# Patient Record
Sex: Female | Born: 1990
Health system: Southern US, Community
[De-identification: ages and names within clinical notes are randomized; demographics above are authoritative.]

## PROBLEM LIST (undated history)

## (undated) DIAGNOSIS — F419 Anxiety disorder, unspecified: Secondary | ICD-10-CM

## (undated) DIAGNOSIS — F32A Depression, unspecified: Secondary | ICD-10-CM

## (undated) DIAGNOSIS — I10 Essential (primary) hypertension: Secondary | ICD-10-CM

---

## 2020-08-08 ENCOUNTER — Emergency Department (HOSPITAL_COMMUNITY)
Admission: EM | Admit: 2020-08-08 | Discharge: 2020-08-08 | Disposition: A | Discharge status: Home / Self Care | Payer: Medicaid Other | Attending: Emergency Medicine | Admitting: Emergency Medicine

## 2020-08-08 ENCOUNTER — Other Ambulatory Visit: Payer: Self-pay

## 2020-08-08 ENCOUNTER — Other Ambulatory Visit (HOSPITAL_COMMUNITY): Payer: Self-pay | Admitting: Physician Assistant

## 2020-08-08 ENCOUNTER — Emergency Department (HOSPITAL_COMMUNITY): Payer: Medicaid Other

## 2020-08-08 ENCOUNTER — Encounter (HOSPITAL_COMMUNITY): Payer: Self-pay

## 2020-08-08 DIAGNOSIS — L298 Other pruritus: Secondary | ICD-10-CM | POA: Insufficient documentation

## 2020-08-08 DIAGNOSIS — I10 Essential (primary) hypertension: Secondary | ICD-10-CM | POA: Insufficient documentation

## 2020-08-08 DIAGNOSIS — Z760 Encounter for issue of repeat prescription: Secondary | ICD-10-CM | POA: Diagnosis not present

## 2020-08-08 DIAGNOSIS — R21 Rash and other nonspecific skin eruption: Secondary | ICD-10-CM | POA: Insufficient documentation

## 2020-08-08 HISTORY — DX: Anxiety disorder, unspecified: F41.9

## 2020-08-08 HISTORY — DX: Depression, unspecified: F32.A

## 2020-08-08 HISTORY — DX: Essential (primary) hypertension: I10

## 2020-08-08 MED ORDER — CEPHALEXIN 500 MG PO CAPS: 500.0000 mg | ORAL_CAPSULE | Freq: Two times a day (BID) | ORAL | 0 refills | Status: DC

## 2020-08-08 MED ORDER — HYDROCORTISONE 1 % EX CREA: TOPICAL_CREAM | CUTANEOUS | 0 refills | Status: DC

## 2020-08-08 MED FILL — CEPHALEXIN 500 MG CAPSULE: 500 | 7 days supply | Qty: 14 | Fill #0

## 2020-08-08 NOTE — Discharge Instructions (Signed)
I given you contact information for dermatology and primary care follow-up.  Take the medications as prescribed.  Return for any worsening symptoms.

## 2020-08-08 NOTE — ED Triage Notes (Signed)
Patient states she thought she had an insect bite to the right outer heel 2 months ago. Patient states she has been treating it with tea tree oil, coconut oil and something else. The area is now scabbed and crusted over. Patient has 2 other small areas to the inner wrist of both hands.

## 2020-08-08 NOTE — ED Provider Notes (Signed)
[facility]-EMERGENCY DEPT Provider Note   CSN: [id number] Arrival date & time: 08/08/20  1039     History Chief Complaint  Patient presents with   Rash   Medication Refill    [facility] is a 29 y.o. female with past medical history significant for hypertension, anxiety who presents for evaluation of rash.  Patient thought she initially was bit by an insect to her right lateral foot 2 months ago.  Patient states had developed scaling, erythema and pruritus to this area.  Area nonpainful.  Has been treating it with tea tree oil, coconut oil.  Patient states the area will randomly scab, bleed and then crust over, improve and then worsen.  Not noticed any bony tenderness, pain with flexion and extension to her joints.  Denies fever, chills, nausea, vomiting, chest pain, shortness of breath, unilateral leg swelling, warmth, paresthesias.  Denies additional aggravating or alleviating factors. No recent tick bites, new lotions, perfumes.  History obtained from patient and past medical records. No interpreter used.  HPI     Past Medical History:  Diagnosis Date   Anxiety    Depression    Hypertension     There are no problems to display for this patient.   History reviewed. No pertinent surgical history.   OB History   No obstetric history on file.     Family History  Problem Relation Age of Onset   Hypertension Mother    Diabetes Mother    Diabetes Father     Social History   Tobacco Use   Smoking status: Never Smoker   Smokeless tobacco: Never Used  Building services engineer Use: Some days   Substances: Nicotine, Flavoring  Substance Use Topics   Alcohol use: Yes    Comment: socially   Drug use: Yes    Types: Marijuana    Home Medications Prior to Admission medications   Medication Sig Start Date End Date Taking? Authorizing Provider  cephALEXin (KEFLEX) 500 MG capsule Take 1 capsule (500 mg total) by mouth 2  (two) times daily for 7 days. 08/08/20 08/15/20  Seidy Labreck A, PA-C  hydrocortisone cream 1 % Apply to affected area 2 times daily 08/08/20   Lelynd Poer A, PA-C    Allergies    Patient has no known allergies.  Review of Systems   Review of Systems  Constitutional: Negative.   HENT: Negative.   Respiratory: Negative.   Cardiovascular: Negative.   Gastrointestinal: Negative.   Genitourinary: Negative.   Musculoskeletal: Negative.   Skin: Positive for rash.  Neurological: Negative.   All other systems reviewed and are negative.   Physical Exam Updated Vital Signs BP (!) 110/95 (BP Location: Left Arm)    Pulse 76    Temp 98 F (36.7 C) (Oral)    Resp 16    Ht 5\' 11"  (1.803 m)    Wt (!) 189.1 kg    LMP 06/25/2020    SpO2 98%    BMI 58.16 kg/m   Physical Exam Vitals and nursing note reviewed.  Constitutional:      General: She is not in acute distress.    Appearance: She is well-developed. She is not ill-appearing, toxic-appearing or diaphoretic.  HENT:     Head: Normocephalic and atraumatic.     Nose: Nose normal.     Mouth/Throat:     Mouth: Mucous membranes are moist.  Eyes:     Pupils: Pupils are equal, round, and reactive to light.  Cardiovascular:     Rate and Rhythm: Normal rate.     Pulses: Normal pulses.     Heart sounds: Normal heart sounds.  Pulmonary:     Effort: Pulmonary effort is normal. No respiratory distress.     Breath sounds: Normal breath sounds.  Abdominal:     General: Bowel sounds are normal. There is no distension.  Musculoskeletal:        General: No swelling, tenderness, deformity or signs of injury. Normal range of motion.     Cervical back: Normal range of motion.     Right lower leg: No edema.     Left lower leg: No edema.     Comments: Full range of motion with flexion, extension to bilateral upper and lower joints.  No tenderness with inversion eversion right ankle.  No bony tenderness.  Compartments soft.  Skin:    General:  Skin is warm and dry.     Capillary Refill: Capillary refill takes less than 2 seconds.     Comments: 10 cm, rounded, scaling, erythematous lesion to right lateral foot.  Some mild underlying soft tissue swelling.  Neurological:     General: No focal deficit present.     Mental Status: She is alert and oriented to person, place, and time.     Comments: Intact sensation Ambulatory without difficulty      ED Results / Procedures / Treatments   Labs (all labs ordered are listed, but only abnormal results are displayed) Labs Reviewed - No data to display  EKG None  Radiology DG Ankle Complete Right  Result Date: 08/08/2020 CLINICAL DATA:  Pain.  Insect bite. EXAM: RIGHT ANKLE - COMPLETE 3+ VIEW COMPARISON:  None. FINDINGS: No evidence of acute fracture or dislocation. No erosive change. Mild midfoot degenerative change. Pes planus. Soft tissue swelling about the ankle and foot. IMPRESSION: 1. No acute fracture or malalignment. 2. Soft tissue swelling without radiographic evidence of osteomyelitis. 3. Pes planus. Electronically Signed   By: Feliberto Harts MD   On: 08/08/2020 13:00    Procedures Procedures (including critical care time)  Medications Ordered in ED Medications - No data to display  ED Course  I have reviewed the triage vital signs and the nursing notes.  Pertinent labs & imaging results that were available during my care of the patient were reviewed by me and considered in my medical decision making (see chart for details).  29 year old presents for evaluation of rash to right foot.  Has been present times months.  10 cm, scaling, erythematous rash to lateral right foot.  No underlying bony tenderness.  She is neurovascularly intact.  She has no pain with range of motion.  No additional rashes.  No involvement to intraoral mucosa.  No recent lotions, perfumes, tick bites.  Pruritic in nature, patient states it does begin to bleed then will heal, scab over and then  recur.  Does seem to have some sort of eczema or atopic dermatitis component.  Will treat with topical medication as well as oral antibiotics in case infectious process.  X-ray does show some soft tissue swelling however no bony effusion, evidence of osteomyelitis, gas-forming organism.  Patient denies any difficulty breathing or swallowing.  Pt has a patent airway without stridor and is handling secretions without difficulty; no angioedema. No blisters, no pustules, no warmth, no draining sinus tracts, no superficial abscesses, no bullous impetigo, no vesicles, no desquamation, no target lesions with dusky purpura or a central bulla. Not tender to  touch. No concern for superimposed infection. No concern for SJS, TEN, TSS, tick borne illness, syphilis or other life-threatening condition.  I have discussed with patient she possibly needs follow-up with dermatology given rashes been present x1 month and has not resolved.  She is also asking for referral to PCP.  I will give her referral at discharge.  The patient has been appropriately medically screened and/or stabilized in the ED. I have low suspicion for any other emergent medical condition which would require further screening, evaluation or treatment in the ED or require inpatient management.  Patient is hemodynamically stable and in no acute distress.  Patient able to ambulate in department prior to ED.  Evaluation does not show acute pathology that would require ongoing or additional emergent interventions while in the emergency department or further inpatient treatment.  I have discussed the diagnosis with the patient and answered all questions.  Pain is been managed while in the emergency department and patient has no further complaints prior to discharge.  Patient is comfortable with plan discussed in room and is stable for discharge at this time.  I have discussed strict return precautions for returning to the emergency department.  Patient was  encouraged to follow-up with PCP/specialist refer to at discharge.   MDM Rules/Calculators/A&P                           Final Clinical Impression(s) / ED Diagnoses Final diagnoses:  Rash    Rx / DC Orders ED Discharge Orders         Ordered    cephALEXin (KEFLEX) 500 MG capsule  2 times daily        08/08/20 1341    hydrocortisone cream 1 %        08/08/20 1341           Shayanna Thatch A, PA-C 08/08/20 1342    Roberta Mulders, MD 08/09/20 [phone]

## 2020-09-05 ENCOUNTER — Other Ambulatory Visit: Payer: Medicaid Other

## 2020-09-05 DIAGNOSIS — Z20822 Contact with and (suspected) exposure to covid-19: Secondary | ICD-10-CM

## 2020-09-06 LAB — SARS-COV-2, NAA 2 DAY TAT

## 2020-09-06 LAB — NOVEL CORONAVIRUS, NAA: SARS-CoV-2, NAA: NOT DETECTED

## 2020-09-18 ENCOUNTER — Other Ambulatory Visit: Payer: Self-pay

## 2020-09-18 ENCOUNTER — Ambulatory Visit (HOSPITAL_COMMUNITY)
Admission: AD | Admit: 2020-09-18 | Discharge: 2020-09-18 | Disposition: A | Discharge status: Home / Self Care | Payer: Medicaid Other | Attending: Psychiatry | Admitting: Psychiatry

## 2020-09-18 ENCOUNTER — Other Ambulatory Visit (HOSPITAL_COMMUNITY): Payer: Self-pay | Admitting: Psychiatry

## 2020-09-18 ENCOUNTER — Encounter (HOSPITAL_COMMUNITY): Payer: Self-pay

## 2020-09-18 DIAGNOSIS — F322 Major depressive disorder, single episode, severe without psychotic features: Secondary | ICD-10-CM | POA: Diagnosis not present

## 2020-09-18 DIAGNOSIS — F439 Reaction to severe stress, unspecified: Secondary | ICD-10-CM | POA: Insufficient documentation

## 2020-09-18 DIAGNOSIS — F332 Major depressive disorder, recurrent severe without psychotic features: Secondary | ICD-10-CM

## 2020-09-18 MED ORDER — TRAZODONE HCL 50 MG PO TABS: 25.0000 mg | ORAL_TABLET | Freq: Every day | ORAL | 0 refills | Status: DC

## 2020-09-18 MED ORDER — BUPROPION HCL ER (XL) 150 MG PO TB24: 150.0000 mg | ORAL_TABLET | ORAL | 0 refills | Status: DC

## 2020-09-18 MED ORDER — FLUOXETINE HCL 20 MG PO CAPS: 20.0000 mg | ORAL_CAPSULE | Freq: Every day | ORAL | 0 refills | Status: DC

## 2020-09-18 MED FILL — traZODone HCL 50 MG TABS: 50 | 30 days supply | Qty: 15 | Fill #0

## 2020-09-18 MED FILL — BUPROPION HCL XL 150 MG TAB: 150 | 30 days supply | Qty: 30 | Fill #0

## 2020-09-18 MED FILL — FLUoxetine HCL 20 MG CAPS: 20 | 30 days supply | Qty: 30 | Fill #0

## 2020-09-18 NOTE — ED Provider Notes (Signed)
[facility] Medical Screening [facility]am  Patient Name: [facility] MRN: [id number] Date of Evaluation: 09/18/20 Chief Complaint: Chief Complaint/Presenting Problem: Ongoing depression and anxiety Diagnosis:  Final diagnoses:  MDD (major depressive disorder), recurrent severe, without psychosis (HCC)    History of Present illness: [facility] Todd is a 29 y.o. female.  Patient presents as a walk-in voluntarily to the [facility] with her significant other.  She reports that she has been dealing with a lot of depression and stress recently.  She states that she moved to [place] from [place] approximately 3 months ago.  She states that she moved here to live with her female partner.  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  She reports that she has a history of major depressive disorder and was started on Wellbutrin, Prozac, and trazodone and was on it for approximately 7 months but stopped it approximately 5 months ago.  She states that with the stress of moving as well as the new relationship and not been able to find employment at this time and is caused her to be more depressed.  She states this morning she had not reached her breaking point and took some lisinopril and put approximately 10 tabs in her mouth but then spit them out and was with her significant other.  Her significant other states that she had they have had a long conversation about it and she feels safe with her discharging home with her.  They both report they do not feel that she needs to be admitted to the hospital.  They are requesting for her to restart her medications of Wellbutrin, Prozac, and trazodone.  She does report that the trazodone seemed to make her a little too drowsy and it was encouraged for her to take a lower dose.  The patient denies having any suicidal or homicidal ideations and  denies any hallucinations at this time.  Patient presents to be alert, oriented, very pleasant.  She is laughing and smiling during the evaluation but continues to report feeling depressed due to the stress.  Safety plan was established with patient's significant other and will be reported that there is a following house but it does have a lock on it and the patient does not have access to it or the key.  Psychiatric Specialty Exam  Presentation  General Appearance:Appropriate for Environment;Casual  Eye Contact:Good  Speech:Clear and Coherent;Normal Rate  Speech Volume:Normal  Handedness:Right   Mood and Affect  Mood:Euthymic  Affect:Appropriate;Congruent   Thought Process  Thought Processes:Coherent  Descriptions of Associations:Intact  Orientation:Full (Time, Place and Person)  Thought Content:WDL  Hallucinations:None  Ideas of Reference:None  Suicidal Thoughts:No  Homicidal Thoughts:No   Sensorium  Memory:Immediate Good;Recent Good;Remote Good  Judgment:Good  Insight:Good   Executive Functions  Concentration:Good  Attention Span:Good  Recall:Good  Fund of Knowledge:Good  Language:Good   Psychomotor Activity  Psychomotor Activity:Normal   Assets  Assets:Communication Skills;Desire for Improvement   Sleep  Sleep:Fair  Number of hours: No data recorded  Physical Exam: Physical Exam Vitals and nursing note reviewed.  Constitutional:      Appearance: She is well-developed. She is obese.  HENT:     Head: Normocephalic.  Eyes:     Pupils: Pupils are equal, round, and reactive to light.  Cardiovascular:     Rate and Rhythm: Normal rate.  Pulmonary:     Effort: Pulmonary  effort is normal.  Musculoskeletal:        General: Normal range of motion.  Neurological:     Mental Status: She is alert and oriented to person, place, and time.    Review of Systems  Constitutional: Negative.   HENT: Negative.   Eyes: Negative.    Respiratory: Negative.   Cardiovascular: Negative.   Gastrointestinal: Negative.   Genitourinary: Negative.   Musculoskeletal: Negative.   Skin: Negative.   Neurological: Negative.   Endo/Heme/Allergies: Negative.   Psychiatric/Behavioral: Positive for depression.   Pulse 92, temperature 98.4 F (36.9 C), temperature source Oral, resp. rate 18, height 5\' 11"  (1.803 m), weight (!) 378 lb (171.5 kg), SpO2 100 %. Body mass index is 52.72 kg/m.  Musculoskeletal: Strength & Muscle Tone: within normal limits Gait & Station: normal Patient leans: N/A   [facility] Discharge Disposition for Follow up and Recommendations: Based on my evaluation the patient does not appear to have an emergency medical condition and can be discharged with resources and follow up care in outpatient services for Medication Management and Individual Therapy   , FNP 09/18/2020, 2:16 PM

## 2020-09-18 NOTE — ED Notes (Signed)
Patient A&O x 4, ambulatory. Patient discharged in no acute distress. Patient denied SI/HI, A/VH upon discharge. Patient verbalized understanding of all discharge instructions explained by staff, to include follow up appointments, RX's and safety plan. Patient reported mood 10/10.  Pt belongings returned to patient from locker #11 intact. Patient escorted to lobby via staff for self transport to destination. Safety maintained.

## 2020-09-18 NOTE — ED Notes (Signed)
LOCKER #[id number] 
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
## 2020-09-18 NOTE — Discharge Instructions (Addendum)
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
## 2020-09-20 ENCOUNTER — Other Ambulatory Visit: Payer: Self-pay

## 2020-09-20 ENCOUNTER — Ambulatory Visit (INDEPENDENT_AMBULATORY_CARE_PROVIDER_SITE_OTHER): Payer: Medicaid Other | Admitting: Clinical

## 2020-09-20 DIAGNOSIS — F331 Major depressive disorder, recurrent, moderate: Secondary | ICD-10-CM
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
## 2020-09-27 ENCOUNTER — Telehealth (HOSPITAL_COMMUNITY): Payer: Self-pay

## 2020-09-27 NOTE — Telephone Encounter (Signed)
Care Management - Follow Up [facility] Discharges   Writer attempted to make contact with patient today and was unsuccessful.  Writer was able to leave a HIPPA compliant voice message and will await callback.  Per chart review, patient has completed an appointment with [facility] on 09-20-2020.  Patient has a follow up visit with Dr. Evelene Croon on 10-17-2020.

## 2020-10-17 ENCOUNTER — Other Ambulatory Visit: Payer: Self-pay

## 2020-10-17 ENCOUNTER — Telehealth (HOSPITAL_COMMUNITY): Payer: Medicaid Other

## 2020-11-10 ENCOUNTER — Ambulatory Visit (HOSPITAL_COMMUNITY): Payer: Medicaid Other | Admitting: Clinical

## 2020-11-10 ENCOUNTER — Other Ambulatory Visit: Payer: Self-pay

## 2020-11-10 ENCOUNTER — Telehealth (HOSPITAL_COMMUNITY): Payer: Self-pay | Admitting: Clinical

## 2020-11-10 NOTE — Telephone Encounter (Signed)
Therapist sent the client a text message link via MyChart for the scheduled virtual therapy visit. Client did not check in using the link. Therapist attempted to call the clients cell phone but received the automated message of "call cannot be completed as dialed". Therapist was unable to leave a voicemail.

## 2020-12-01 ENCOUNTER — Ambulatory Visit (HOSPITAL_COMMUNITY): Payer: Self-pay | Admitting: Clinical

## 2021-04-18 DIAGNOSIS — R69 Illness, unspecified: Secondary | ICD-10-CM | POA: Diagnosis not present

## 2021-04-18 DIAGNOSIS — F419 Anxiety disorder, unspecified: Secondary | ICD-10-CM | POA: Diagnosis not present

## 2021-05-09 DIAGNOSIS — F419 Anxiety disorder, unspecified: Secondary | ICD-10-CM | POA: Diagnosis not present

## 2021-05-09 DIAGNOSIS — R69 Illness, unspecified: Secondary | ICD-10-CM | POA: Diagnosis not present

## 2021-10-02 ENCOUNTER — Other Ambulatory Visit: Payer: Self-pay

## 2021-10-02 ENCOUNTER — Emergency Department (HOSPITAL_COMMUNITY): Payer: 59

## 2021-10-02 ENCOUNTER — Encounter (HOSPITAL_COMMUNITY): Payer: Self-pay

## 2021-10-02 ENCOUNTER — Emergency Department (HOSPITAL_COMMUNITY)
Admission: EM | Admit: 2021-10-02 | Discharge: 2021-10-02 | Disposition: A | Discharge status: Home / Self Care | Payer: 59 | Attending: Student | Admitting: Student

## 2021-10-02 DIAGNOSIS — K5792 Diverticulitis of intestine, part unspecified, without perforation or abscess without bleeding: Secondary | ICD-10-CM | POA: Diagnosis not present

## 2021-10-02 DIAGNOSIS — Z20822 Contact with and (suspected) exposure to covid-19: Secondary | ICD-10-CM | POA: Insufficient documentation

## 2021-10-02 DIAGNOSIS — R1032 Left lower quadrant pain: Secondary | ICD-10-CM | POA: Diagnosis present

## 2021-10-02 DIAGNOSIS — I10 Essential (primary) hypertension: Secondary | ICD-10-CM | POA: Diagnosis not present

## 2021-10-02 DIAGNOSIS — D72829 Elevated white blood cell count, unspecified: Secondary | ICD-10-CM | POA: Diagnosis not present

## 2021-10-02 DIAGNOSIS — R109 Unspecified abdominal pain: Secondary | ICD-10-CM | POA: Diagnosis not present

## 2021-10-02 LAB — CBC
HCT: 35.6 % — ABNORMAL LOW (ref 36.0–46.0)
Hemoglobin: 10.7 g/dL — ABNORMAL LOW (ref 12.0–15.0)
MCH: 23.2 pg — ABNORMAL LOW (ref 26.0–34.0)
MCHC: 30.1 g/dL (ref 30.0–36.0)
MCV: 77.2 fL — ABNORMAL LOW (ref 80.0–100.0)
Platelets: 324 10*3/uL (ref 150–400)
RBC: 4.61 MIL/uL (ref 3.87–5.11)
RDW: 16.4 % — ABNORMAL HIGH (ref 11.5–15.5)
WBC: 15.1 10*3/uL — ABNORMAL HIGH (ref 4.0–10.5)
nRBC: 0 % (ref 0.0–0.2)

## 2021-10-02 LAB — URINALYSIS, ROUTINE W REFLEX MICROSCOPIC
Bilirubin Urine: NEGATIVE
Glucose, UA: NEGATIVE mg/dL
Hgb urine dipstick: NEGATIVE
Ketones, ur: 5 mg/dL — AB
Leukocytes,Ua: NEGATIVE
Nitrite: NEGATIVE
Protein, ur: NEGATIVE mg/dL
Specific Gravity, Urine: 1.027 (ref 1.005–1.030)
pH: 5 (ref 5.0–8.0)

## 2021-10-02 LAB — COMPREHENSIVE METABOLIC PANEL
ALT: 8 U/L (ref 0–44)
AST: 18 U/L (ref 15–41)
Albumin: 3.6 g/dL (ref 3.5–5.0)
Alkaline Phosphatase: 65 U/L (ref 38–126)
Anion gap: 9 (ref 5–15)
BUN: 12 mg/dL (ref 6–20)
CO2: 19 mmol/L — ABNORMAL LOW (ref 22–32)
Calcium: 9.2 mg/dL (ref 8.9–10.3)
Chloride: 106 mmol/L (ref 98–111)
Creatinine, Ser: 0.78 mg/dL (ref 0.44–1.00)
GFR, Estimated: >60 mL/min (ref >60)
Glucose, Bld: 95 mg/dL (ref 70–99)
Potassium: 4.3 mmol/L (ref 3.5–5.1)
Sodium: 134 mmol/L — ABNORMAL LOW (ref 135–145)
Total Bilirubin: 1.2 mg/dL (ref 0.3–1.2)
Total Protein: 7.4 g/dL (ref 6.5–8.1)

## 2021-10-02 LAB — RESP PANEL BY RT-PCR (FLU A&B, COVID) ARPGX2
Influenza A by PCR: NEGATIVE
Influenza B by PCR: NEGATIVE
SARS Coronavirus 2 by RT PCR: NEGATIVE

## 2021-10-02 LAB — PREGNANCY, URINE: Preg Test, Ur: NEGATIVE

## 2021-10-02 LAB — LIPASE, BLOOD: Lipase: 24 U/L (ref 11–51)

## 2021-10-02 LAB — I-STAT BETA HCG BLOOD, ED (MC, WL, AP ONLY): I-stat hCG, quantitative: 6.7 m[IU]/mL — ABNORMAL HIGH (ref <5)

## 2021-10-02 MED ORDER — MORPHINE SULFATE (PF) 4 MG/ML IV SOLN
4.0000 mg | Freq: Once | INTRAVENOUS | Status: DC
  Filled 2021-10-02: qty 1

## 2021-10-02 MED ORDER — LACTATED RINGERS IV BOLUS
1000.0000 mL | Freq: Once | INTRAVENOUS | Status: AC
  Administered 2021-10-02: 1000 mL via INTRAVENOUS

## 2021-10-02 MED ORDER — IOHEXOL 350 MG/ML SOLN
80.0000 mL | Freq: Once | INTRAVENOUS | Status: AC | PRN
  Administered 2021-10-02: 80 mL via INTRAVENOUS

## 2021-10-02 MED ORDER — ONDANSETRON HCL 4 MG/2ML IJ SOLN: 4.0000 mg | Freq: Once | INTRAMUSCULAR | Status: AC

## 2021-10-02 MED ORDER — KETOROLAC TROMETHAMINE 15 MG/ML IJ SOLN
15.0000 mg | Freq: Once | INTRAMUSCULAR | Status: AC
  Administered 2021-10-02: 15 mg via INTRAVENOUS
  Filled 2021-10-02: qty 1

## 2021-10-02 MED ORDER — AMOXICILLIN-POT CLAVULANATE 875-125 MG PO TABS
1.0000 | ORAL_TABLET | Freq: Two times a day (BID) | ORAL | 0 refills | Status: AC
  Filled 2021-10-02: qty 14, 7d supply, fill #0

## 2021-10-02 MED ORDER — ONDANSETRON 4 MG PO TBDP
4.0000 mg | ORAL_TABLET | Freq: Once | ORAL | Status: DC
  Filled 2021-10-02: qty 1

## 2021-10-02 MED ORDER — ONDANSETRON HCL 4 MG/2ML IJ SOLN
INTRAMUSCULAR | Status: AC
  Administered 2021-10-02: 4 mg via INTRAVENOUS
  Filled 2021-10-02: qty 2

## 2021-10-02 MED ORDER — IBUPROFEN 600 MG PO TABS
600.0000 mg | ORAL_TABLET | Freq: Four times a day (QID) | ORAL | 0 refills | Status: AC | PRN
  Filled 2021-10-02: qty 30, 8d supply, fill #0

## 2021-10-02 MED ORDER — MORPHINE SULFATE (PF) 4 MG/ML IV SOLN
6.0000 mg | Freq: Once | INTRAVENOUS | Status: AC
  Administered 2021-10-02: 6 mg via INTRAVENOUS

## 2021-10-02 MED ORDER — ONDANSETRON 4 MG PO TBDP
4.0000 mg | ORAL_TABLET | Freq: Three times a day (TID) | ORAL | 0 refills | Status: AC | PRN
  Filled 2021-10-02: qty 20, 7d supply, fill #0

## 2021-10-02 MED ORDER — AMOXICILLIN-POT CLAVULANATE 875-125 MG PO TABS
1.0000 | ORAL_TABLET | Freq: Once | ORAL | Status: AC
  Administered 2021-10-02: 1 via ORAL
  Filled 2021-10-02: qty 1

## 2021-10-02 MED ORDER — HYDROCODONE-ACETAMINOPHEN 5-325 MG PO TABS: 1.0000 | ORAL_TABLET | ORAL | 0 refills | Status: AC | PRN

## 2021-10-02 NOTE — ED Notes (Signed)
Pt to CT, VS when returned.

## 2021-10-02 NOTE — ED Notes (Signed)
Informed Pt for the need of urine.

## 2021-10-02 NOTE — ED Provider Notes (Signed)
[facility]-EMERGENCY DEPT Provider Note   CSN: [id number] Arrival date & time: 10/02/21  3016     History Chief Complaint  Patient presents with   Abdominal Pain   Emesis    Roberta Todd is a 30 y.o. female with PMH anxiety, depression, HTN, obesity who presents emergency department for evaluation of abdominal pain nausea and vomiting.  Patient states that her symptoms began over the last 24 hours and are worse in the left lower quadrant.  Also endorses left CVA pain.  Denies dysuria, increased frequency, vaginal discharge, chest pain, shortness of breath, headache, cough, fever or other systemic symptoms.  Patient states that she is on birth control and has not been sexually active.  Last menstrual period 12 days ago but she states that she started spotting again.  She does have a history of abnormal uterine bleeding.  She states she had multiple episodes of nonbloody nonbilious emesis last night but has not had any p.o. this morning and thus has not had any additional vomiting.   Abdominal Pain Associated symptoms: nausea and vomiting   Associated symptoms: no chest pain, no chills, no cough, no dysuria, no fever, no hematuria, no shortness of breath and no sore throat   Emesis Associated symptoms: abdominal pain   Associated symptoms: no arthralgias, no chills, no cough, no fever and no sore throat       Past Medical History:  Diagnosis Date   Anxiety    Depression    Hypertension     There are no problems to display for this patient.   History reviewed. No pertinent surgical history.   OB History   No obstetric history on file.     Family History  Problem Relation Age of Onset   Hypertension Mother    Diabetes Mother    Diabetes Father     Social History   Tobacco Use   Smoking status: Never   Smokeless tobacco: Never  Vaping Use   Vaping Use: Former   Substances: Nicotine, Flavoring  Substance Use Topics   Alcohol  use: Yes    Comment: socially   Drug use: Not Currently    Types: Marijuana    Home Medications Prior to Admission medications   Medication Sig Start Date End Date Taking? Authorizing Provider  amoxicillin-clavulanate (AUGMENTIN) 875-125 MG tablet Take 1 tablet by mouth every 12 (twelve) hours. 10/02/21  Yes Avleen Bordwell, MD  HYDROcodone-acetaminophen (NORCO/VICODIN) 5-325 MG tablet Take 1 tablet by mouth every 4 (four) hours as needed. 10/02/21  Yes Lindi Abram, MD  ibuprofen (ADVIL) 600 MG tablet Take 1 tablet (600 mg total) by mouth every 6 (six) hours as needed. 10/02/21  Yes Ahmere Hemenway, MD  ondansetron (ZOFRAN-ODT) 4 MG disintegrating tablet Take 1 tablet (4 mg total) by mouth every 8 (eight) hours as needed for nausea or vomiting. 10/02/21  Yes Gabrella Stroh, MD  buPROPion (WELLBUTRIN XL) 150 MG 24 hr tablet TAKE 1 TABLET (150 MG TOTAL) BY MOUTH EVERY MORNING. 09/18/20 09/18/21  Money, Gerlene Burdock, FNP  FLUoxetine (PROZAC) 20 MG capsule TAKE 1 CAPSULE (20 MG TOTAL) BY MOUTH DAILY. 09/18/20 09/18/21  Money, Gerlene Burdock, FNP  traZODone (DESYREL) 50 MG tablet TAKE 1/2 TABLET (25 MG TOTAL) BY MOUTH AT BEDTIME. 09/18/20 09/18/21  Money, Gerlene Burdock, FNP    Allergies    Patient has no known allergies.  Review of Systems   Review of Systems  Constitutional:  Negative for chills and fever.  HENT:  Negative for ear pain and sore throat.   Eyes:  Negative for pain and visual disturbance.  Respiratory:  Negative for cough and shortness of breath.   Cardiovascular:  Negative for chest pain and palpitations.  Gastrointestinal:  Positive for abdominal pain, nausea and vomiting.  Genitourinary:  Negative for dysuria and hematuria.  Musculoskeletal:  Negative for arthralgias and back pain.  Skin:  Negative for color change and rash.  Neurological:  Negative for seizures and syncope.  All other systems reviewed and are negative.  Physical Exam Updated Vital Signs BP 140/87 (BP  Location: Right Arm)   Pulse 65   Temp 97.7 F (36.5 C) (Oral)   Resp 17   Ht 5\' 10"  (1.778 m)   Wt (!) 180.5 kg   LMP 09/22/2021   SpO2 98%   BMI 57.11 kg/m   Physical Exam Vitals and nursing note reviewed.  Constitutional:      General: She is not in acute distress.    Appearance: She is well-developed. She is ill-appearing.  HENT:     Head: Normocephalic and atraumatic.  Eyes:     Conjunctiva/sclera: Conjunctivae normal.  Cardiovascular:     Rate and Rhythm: Normal rate and regular rhythm.     Heart sounds: No murmur heard. Pulmonary:     Effort: Pulmonary effort is normal. No respiratory distress.     Breath sounds: Normal breath sounds.  Abdominal:     Palpations: Abdomen is soft.     Tenderness: There is abdominal tenderness in the left lower quadrant. There is left CVA tenderness.  Musculoskeletal:        General: No swelling.     Cervical back: Neck supple.  Skin:    General: Skin is warm and dry.     Capillary Refill: Capillary refill takes less than 2 seconds.  Neurological:     Mental Status: She is alert.  Psychiatric:        Mood and Affect: Mood normal.    ED Results / Procedures / Treatments   Labs (all labs ordered are listed, but only abnormal results are displayed) Labs Reviewed  COMPREHENSIVE METABOLIC PANEL - Abnormal; Notable for the following components:      Result Value   Sodium 134 (*)    CO2 19 (*)    All other components within normal limits  CBC - Abnormal; Notable for the following components:   WBC 15.1 (*)    Hemoglobin 10.7 (*)    HCT 35.6 (*)    MCV 77.2 (*)    MCH 23.2 (*)    RDW 16.4 (*)    All other components within normal limits  URINALYSIS, ROUTINE W REFLEX MICROSCOPIC - Abnormal; Notable for the following components:   APPearance HAZY (*)    Ketones, ur 5 (*)    All other components within normal limits  I-STAT BETA HCG BLOOD, ED ([facility], [facility], [facility] ONLY) - Abnormal; Notable for the following components:   I-stat hCG,  quantitative 6.7 (*)    All other components within normal limits  RESP PANEL BY RT-PCR (FLU A&B, COVID) ARPGX2  LIPASE, BLOOD  PREGNANCY, URINE    EKG None  Radiology CT ABDOMEN PELVIS W CONTRAST  Result Date: 10/02/2021 CLINICAL DATA:  Left lower quadrant abdominal pain. EXAM: CT ABDOMEN AND PELVIS WITH CONTRAST TECHNIQUE: Multidetector CT imaging of the abdomen and pelvis was performed using the standard protocol following bolus administration of intravenous contrast. CONTRAST:  18mL OMNIPAQUE IOHEXOL 350 MG/ML SOLN COMPARISON:  None. FINDINGS: Lower chest: No acute abnormality. Hepatobiliary: No suspicious hepatic lesion. Gallbladder is unremarkable. No biliary ductal dilation. Pancreas: No pancreatic ductal dilation or evidence of acute inflammation. Spleen: Within normal limits. Adrenals/Urinary Tract: Adrenal glands are unremarkable. Kidneys are normal, without renal calculi, solid enhancing lesion, or hydronephrosis. Bladder is unremarkable for degree of distension. Stomach/Bowel: No enteric contrast was administered. Stomach is unremarkable for degree of distension. No pathologic dilation of small or large bowel. The appendix and terminal ileum appear normal. Colonic diverticulosis with short segment inflammatory wall thickening and adjacent stranding involving the descending colon. No pneumatosis. Vascular/Lymphatic: No significant vascular findings are present. No pathologically enlarged abdominal or pelvic lymph nodes. Reproductive: Uterus and bilateral ovaries are unremarkable. Tampon in the vagina. Other: No walled off fluid collections.  No pneumoperitoneum. Musculoskeletal: No acute osseous abnormality. IMPRESSION: Acute uncomplicated descending colonic diverticulitis. Electronically Signed   By: Maudry MayhewJeffrey  Waltz M.D.   On: 10/02/2021 11:19    Procedures Procedures   Medications Ordered in ED Medications  lactated ringers bolus 1,000 mL (0 mLs Intravenous Stopped 10/02/21 1129)   morphine 4 MG/ML injection 6 mg (6 mg Intravenous Given 10/02/21 0822)  ondansetron (ZOFRAN) injection 4 mg (4 mg Intravenous Given 10/02/21 0823)  iohexol (OMNIPAQUE) 350 MG/ML injection 80 mL (80 mLs Intravenous Contrast Given 10/02/21 1059)  amoxicillin-clavulanate (AUGMENTIN) 875-125 MG per tablet 1 tablet (1 tablet Oral Given 10/02/21 1152)  ketorolac (TORADOL) 15 MG/ML injection 15 mg (15 mg Intravenous Given 10/02/21 1152)    ED Course  I have reviewed the triage vital signs and the nursing notes.  Pertinent labs & imaging results that were available during my care of the patient were reviewed by me and considered in my medical decision making (see chart for details).    MDM Rules/Calculators/A&P                           Patient seen emergency department for evaluation of abdominal pain.  Physical exam reveals left lower quadrant tenderness to palpation but no guarding.  Laboratory evaluation with a significant leukocytosis to 15.1, CO2 19 but is otherwise unremarkable.  I-STAT hCG elevated at 6.7 but follow-up urine pregnancy is negative.  Low suspicion for intrauterine pregnancy.  COVID and flu negative.  CT abdomen pelvis with acute uncomplicated a sending diverticulitis.  Patient given first dose of Augmentin here and pain controlled with morphine and Toradol.  Patient given a prescription for Augmentin, ibuprofen and a short prescription for Norco for breakthrough pain only.  She will follow-up with her primary care physician as this is not her first episode of abdominal pain like this.  Patient then discharged. Final Clinical Impression(s) / ED Diagnoses Final diagnoses:  Diverticulitis    Rx / DC Orders ED Discharge Orders          Ordered    amoxicillin-clavulanate (AUGMENTIN) 875-125 MG tablet  Every 12 hours        10/02/21 1123    ondansetron (ZOFRAN-ODT) 4 MG disintegrating tablet  Every 8 hours PRN        10/02/21 1123    ibuprofen (ADVIL) 600 MG tablet  Every 6  hours PRN        10/02/21 1133    HYDROcodone-acetaminophen (NORCO/VICODIN) 5-325 MG tablet  Every 4 hours PRN        10/02/21 1134             Ayrton Mcvay, Wyn ForsterMadison, MD 10/02/21 1336

## 2021-10-02 NOTE — ED Triage Notes (Signed)
Patient c/o left abdominal pain, emesis that started yesterday.. Patient denies diarrhea.

## 2023-04-08 IMAGING — CT CT ABD-PELV W/ CM
2 of 4 series · 17 of 46 positions shown, 19 images · IV contrast (OMNIPAQUE 350)
Comparison: None.

CLINICAL DATA: Left lower quadrant abdominal pain.

EXAM:
CT ABDOMEN AND PELVIS WITH CONTRAST
TECHNIQUE: Multidetector CT imaging of the abdomen and pelvis was performed
using the standard protocol following bolus administration of
intravenous contrast.
CONTRAST:  80mL OMNIPAQUE IOHEXOL 350 MG/ML SOLN

[Series 2: axial st · axial · 0.87mm/px · z∈[-148,+277]mm · 14 of 97 slices shown, 16 images]
[im 6/97  soft-tissue]
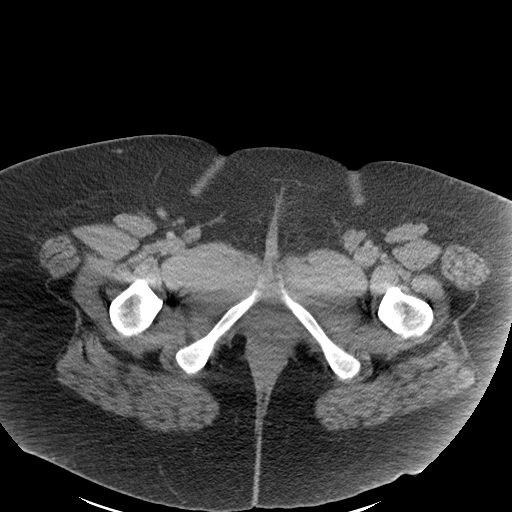
[im 6/97  bone]
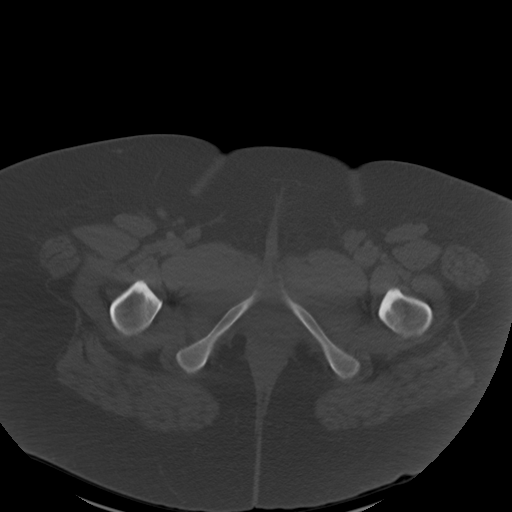
[im 12/97  soft-tissue]
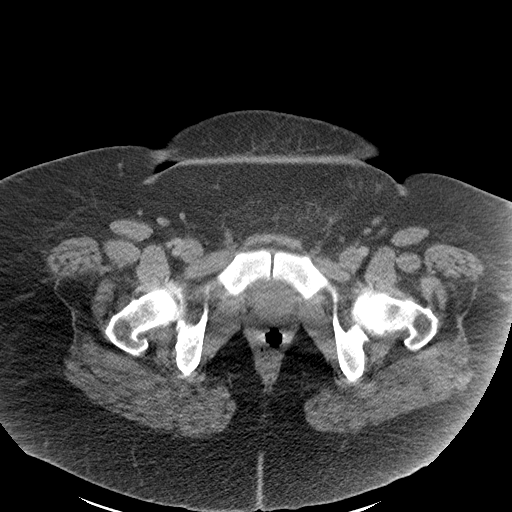
[im 17/97  soft-tissue]
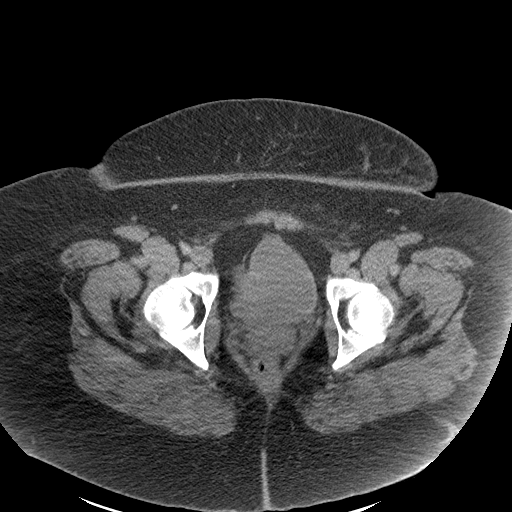
[im 29/97  soft-tissue]
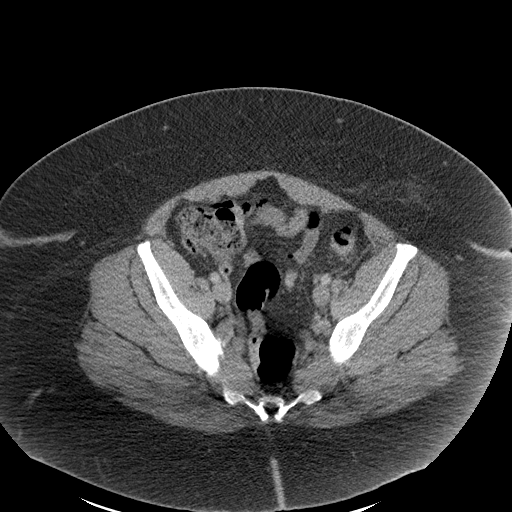
[im 34/97  soft-tissue]
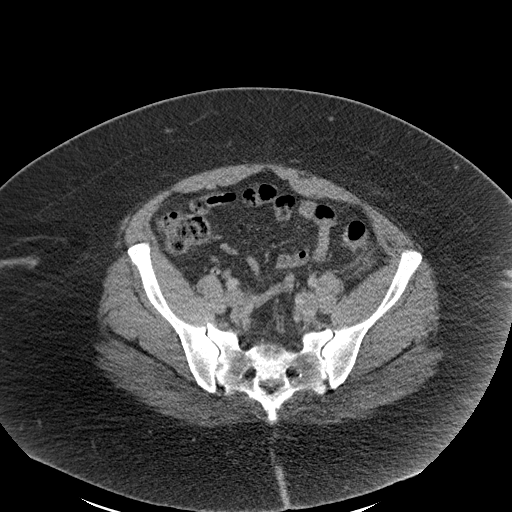
[im 40/97  soft-tissue]
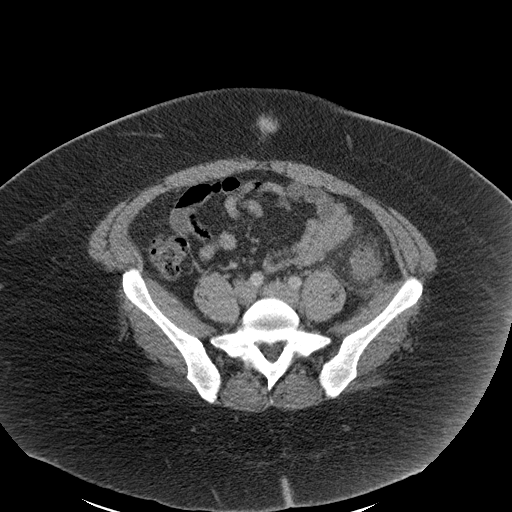
[im 46/97  soft-tissue]
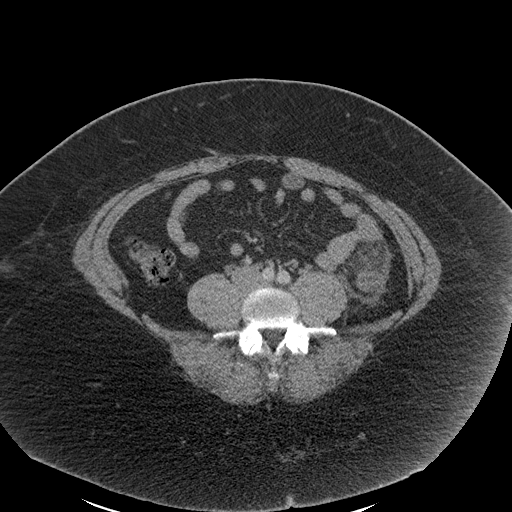
[im 51/97  soft-tissue]
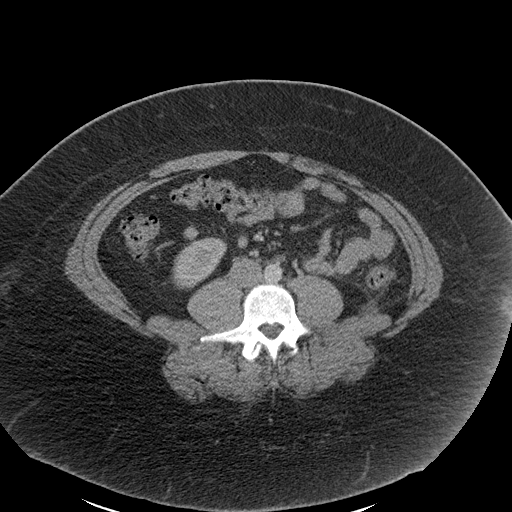
[im 57/97  soft-tissue]
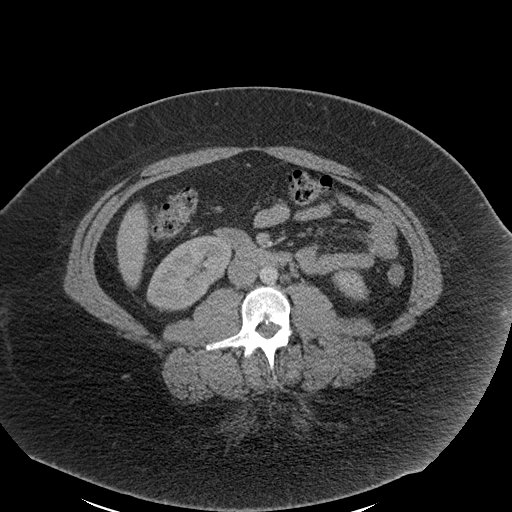
[im 57/97  bone]
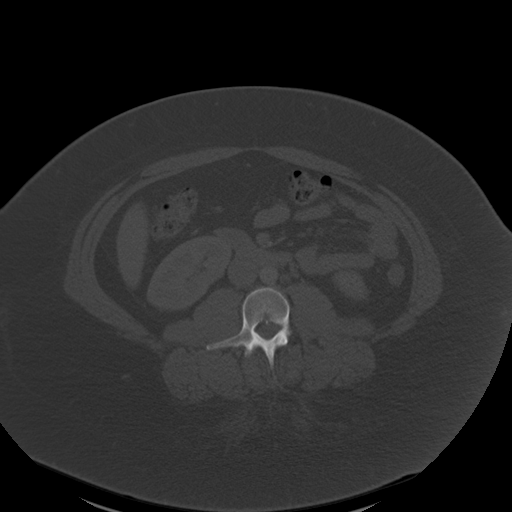
[im 63/97  soft-tissue]
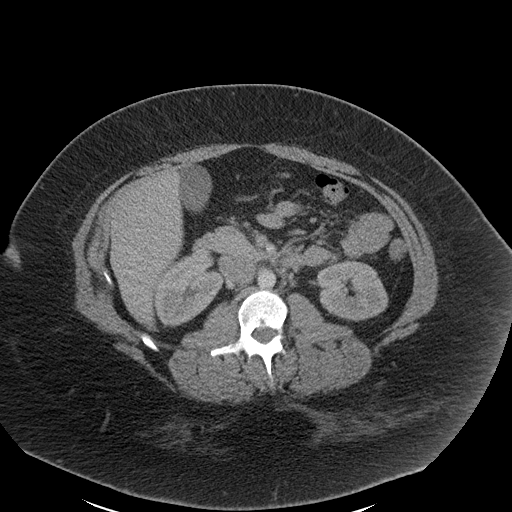
[im 74/97  soft-tissue]
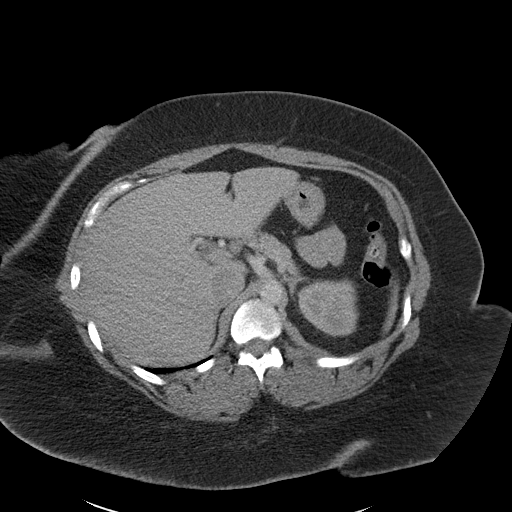
[im 80/97  soft-tissue]
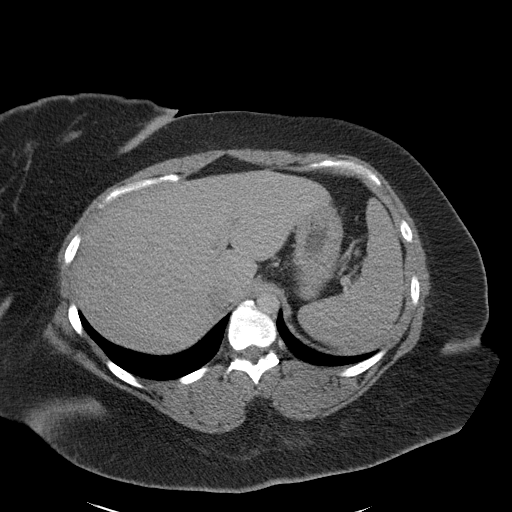
[im 85/97  soft-tissue]
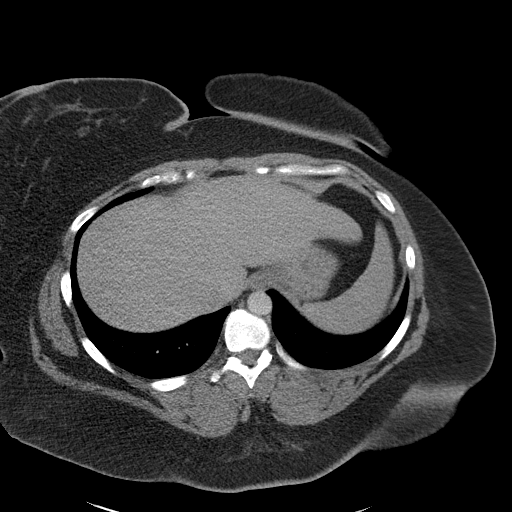
[im 91/97  soft-tissue]
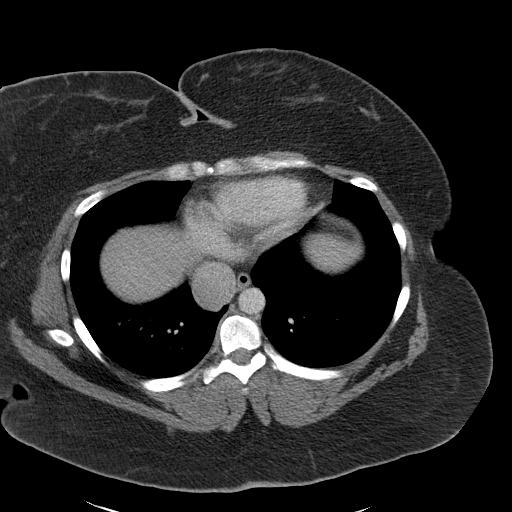

[Series 5: coronal st · coronal · 1.04mm/px · 3 of 117 slices shown]
[im 39/117  soft-tissue]
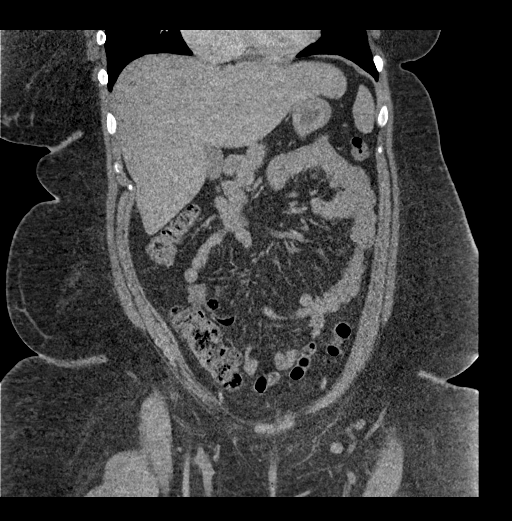
[im 52/117  soft-tissue]
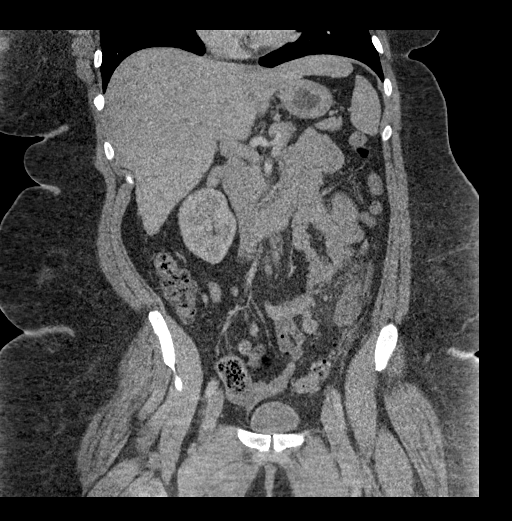
[im 65/117  soft-tissue]
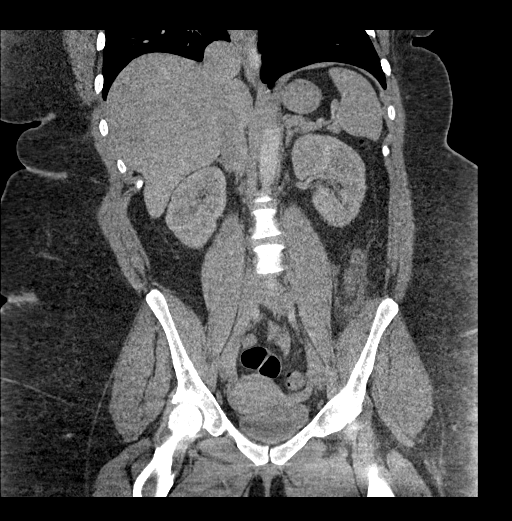

[17 of 46 positions shown; findings below may reference images not displayed]

FINDINGS: Lower chest: No acute abnormality.

Hepatobiliary: No suspicious hepatic lesion. Gallbladder is
unremarkable. No biliary ductal dilation.

Pancreas: No pancreatic ductal dilation or evidence of acute
inflammation.

Spleen: Within normal limits.

Adrenals/Urinary Tract: Adrenal glands are unremarkable. Kidneys are
normal, without renal calculi, solid enhancing lesion, or
hydronephrosis. Bladder is unremarkable for degree of distension.

Stomach/Bowel: No enteric contrast was administered. Stomach is
unremarkable for degree of distension. No pathologic dilation of
small or large bowel. The appendix and terminal ileum appear normal.
Colonic diverticulosis with short segment inflammatory wall
thickening and adjacent stranding involving the descending colon. No
pneumatosis.

Vascular/Lymphatic: No significant vascular findings are present. No
pathologically enlarged abdominal or pelvic lymph nodes.

Reproductive: Uterus and bilateral ovaries are unremarkable. Tampon
in the vagina.

Other: No walled off fluid collections.  No pneumoperitoneum.

Musculoskeletal: No acute osseous abnormality.
IMPRESSION: Acute uncomplicated descending colonic diverticulitis.
